# Patient Record
Sex: Female | Born: 1970 | Race: White | Hispanic: No | Marital: Married | Smoking: Never smoker
Health system: Southern US, Community
[De-identification: ages and names within clinical notes are randomized; demographics above are authoritative.]

---

## 2014-07-09 ENCOUNTER — Ambulatory Visit (INDEPENDENT_AMBULATORY_CARE_PROVIDER_SITE_OTHER): Payer: 59

## 2014-07-09 ENCOUNTER — Other Ambulatory Visit: Payer: Self-pay | Admitting: Family Medicine

## 2014-07-09 DIAGNOSIS — M25551 Pain in right hip: Secondary | ICD-10-CM

## 2014-07-09 DIAGNOSIS — M25559 Pain in unspecified hip: Secondary | ICD-10-CM

## 2015-05-17 IMAGING — CR DG HIP COMPLETE 2+V*R*
3 series · 3 of 3 positions shown · non-contrast
Comparison: None.

CLINICAL DATA: Right hip pain 9-10 months.  No injury.

EXAM:
RIGHT HIP - COMPLETE 2+ VIEW

[view not recorded (1 of 3)]
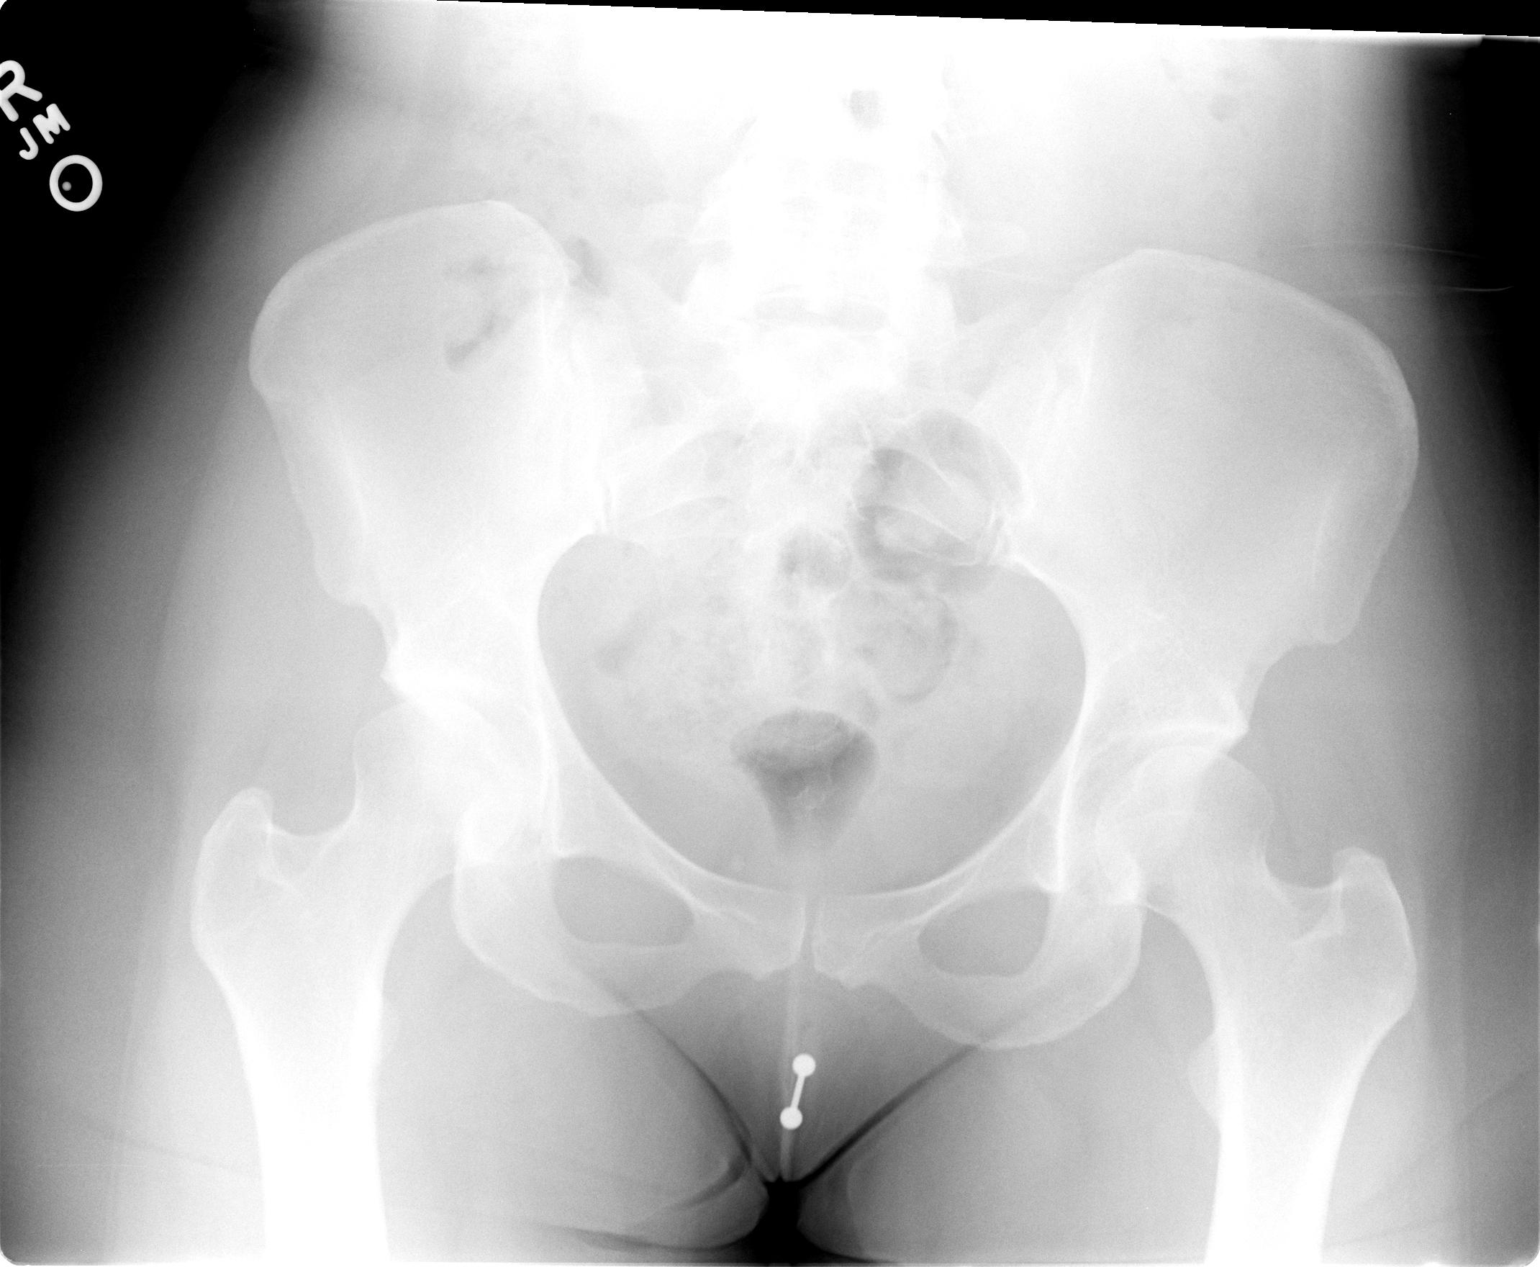

[view not recorded (2 of 3)]
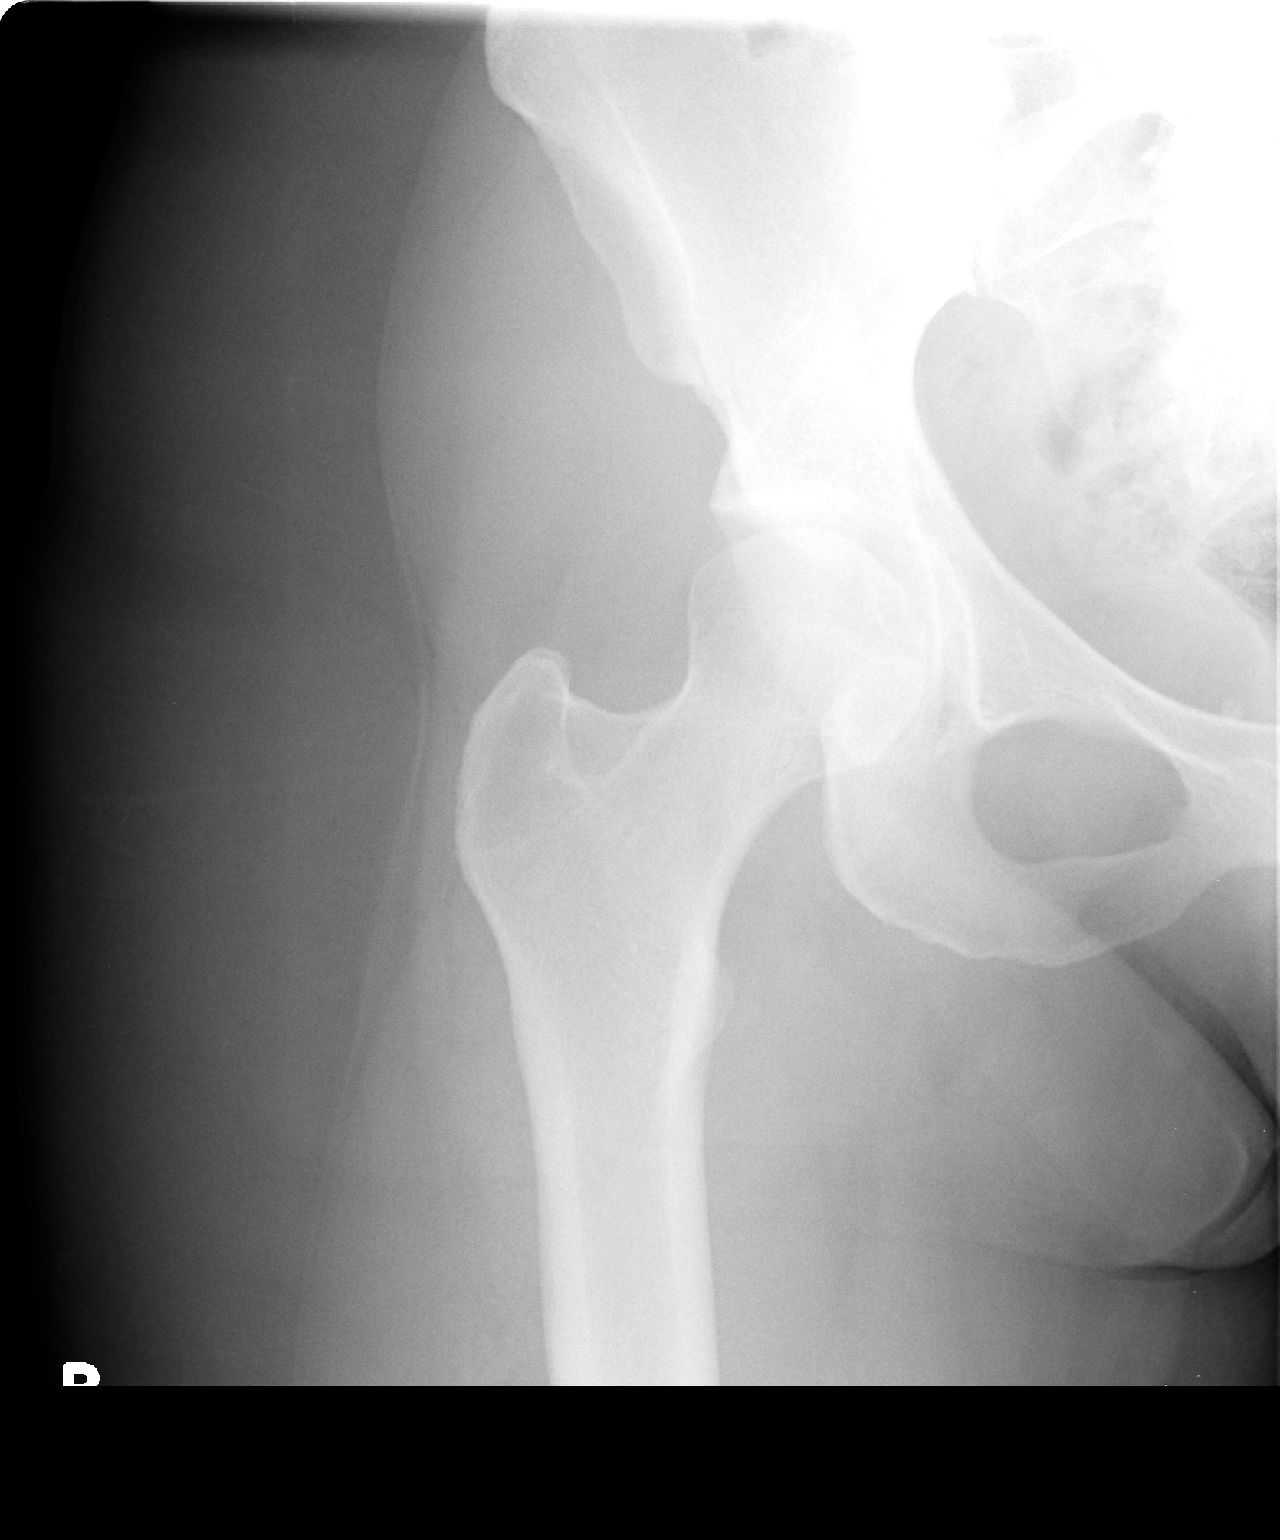

[view not recorded (3 of 3)]
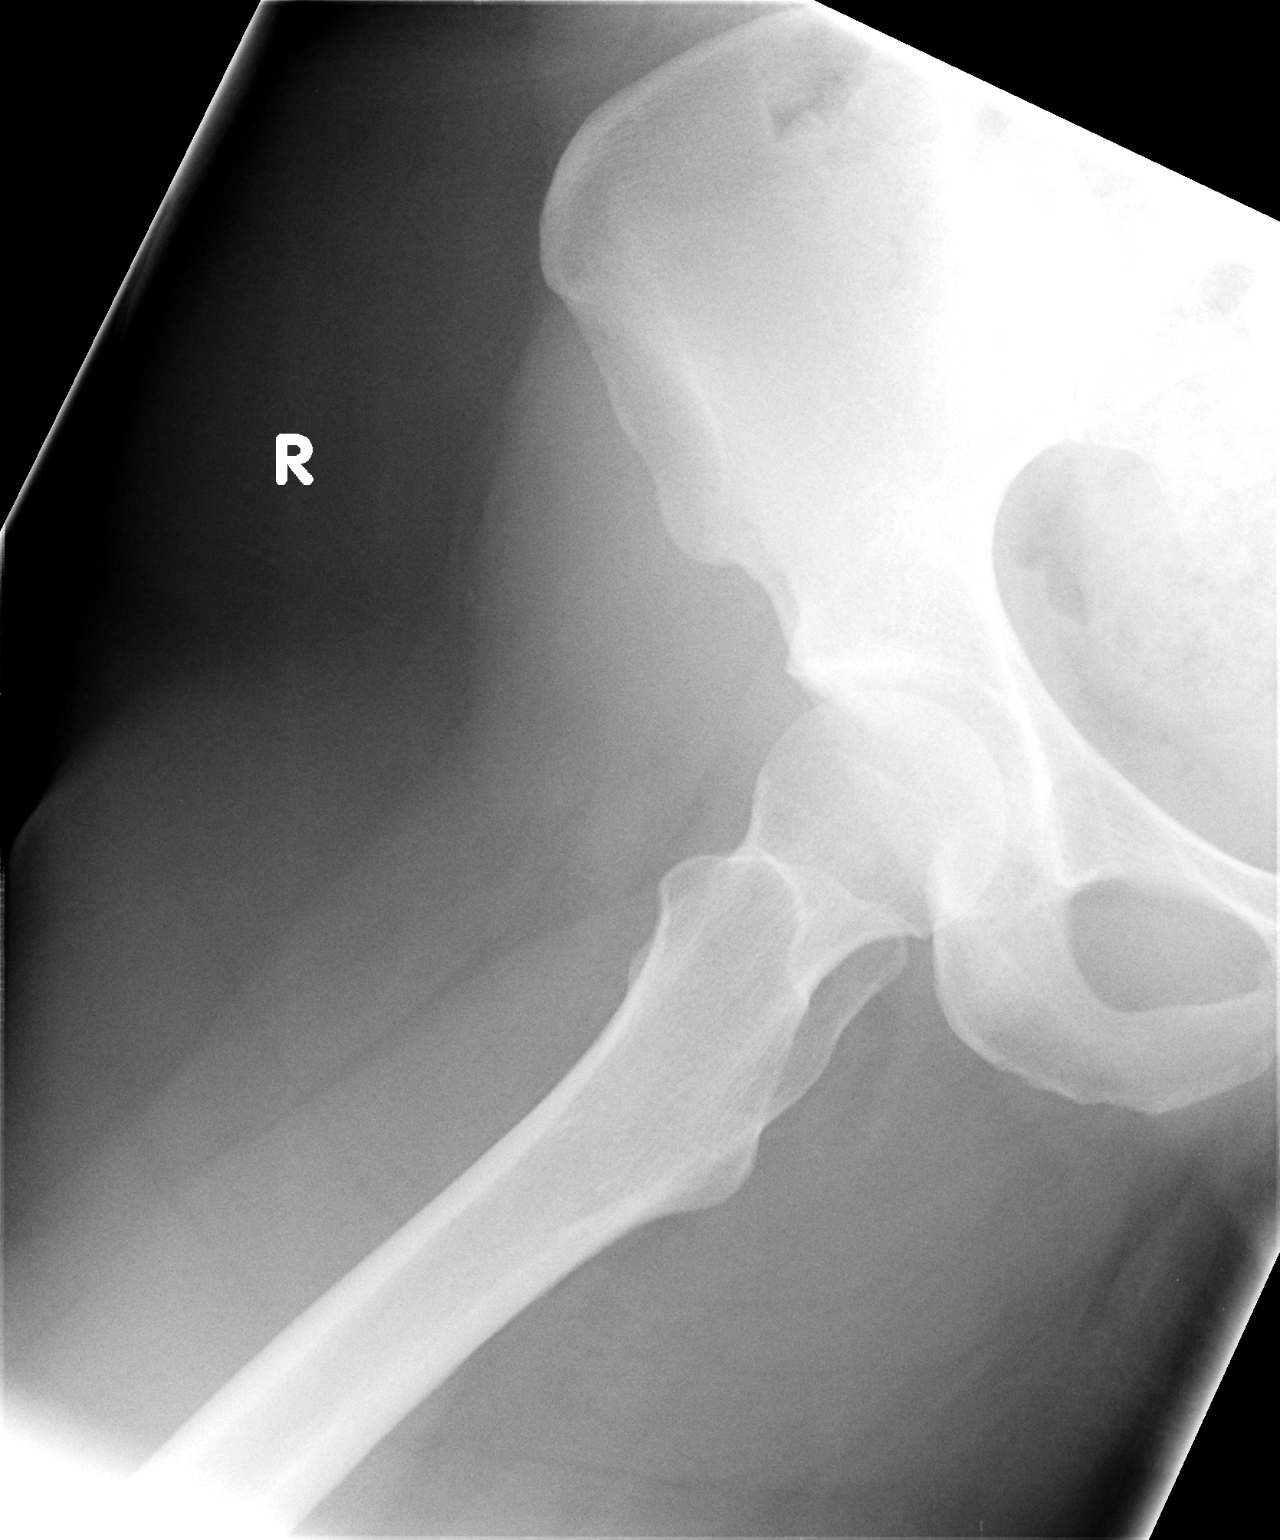

[3 of 3 positions shown; findings below may reference images not displayed]

FINDINGS: There is no evidence of hip fracture or dislocation. There is no
evidence of arthropathy or other focal bone abnormality.
IMPRESSION: Negative.

## 2022-08-05 ENCOUNTER — Ambulatory Visit
Admission: EM | Admit: 2022-08-05 | Discharge: 2022-08-05 | Disposition: A | Discharge status: Home / Self Care | Payer: Self-pay | Attending: Family Medicine | Admitting: Family Medicine

## 2022-08-05 DIAGNOSIS — M7989 Other specified soft tissue disorders: Secondary | ICD-10-CM

## 2022-08-05 MED ORDER — KETOROLAC TROMETHAMINE 60 MG/2ML IM SOLN
60.0000 mg | Freq: Once | INTRAMUSCULAR | Status: AC
  Administered 2022-08-05: 60 mg via INTRAMUSCULAR

## 2022-08-05 NOTE — ED Provider Notes (Signed)
[facility]    CSN: [id number] Arrival date & time: 08/05/22  1101      History   Chief Complaint Chief Complaint  Patient presents with   Arm Swelling    Left arm swelling     HPI VELVA MOLINARI is a 51 y.o. female.   HPI  Patient was admitted to the hospital from 07/31/2022 to 08/03/2022 for an acute bleeding ulcer with microcytic anemia, hemoglobin of 6.2. She had blood transfusions.  She is here because of left arm swelling in the arm where she had her IV. No chest pain or shortness of breath.  No history of clots History reviewed. No pertinent past medical history.  There are no problems to display for this patient.   History reviewed. No pertinent surgical history.  OB History   No obstetric history on file.      Home Medications    Prior to Admission medications   Not on File    Family History History reviewed. No pertinent family history.  Social History Social History   Tobacco Use   Smoking status: Never   Smokeless tobacco: Never  Substance Use Topics   Drug use: Never     Allergies   Diphenhydramine hcl, Sulfa antibiotics, and Cephalexin   Review of Systems Review of Systems   Physical Exam Triage Vital Signs ED Triage Vitals  Enc Vitals Group     BP 08/05/22 1117 (!) 131/90     Pulse Rate 08/05/22 1117 77     Resp 08/05/22 1117 20     Temp 08/05/22 1117 98.7 F (37.1 C)     Temp Source 08/05/22 1117 Oral     SpO2 08/05/22 1117 99 %     Weight 08/05/22 1120 130 lb (59 kg)     Height --      Head Circumference --      Peak Flow --      Pain Score 08/05/22 1119 8     Pain Loc --      Pain Edu? --      Excl. in GC? --    No data found.  Updated Vital Signs BP (!) 131/90 (BP Location: Right Arm)   Pulse 77   Temp 98.7 F (37.1 C) (Oral)   Resp 20   Wt 59 kg   SpO2 99%      Physical Exam Constitutional:      General: She is in acute distress.     Appearance: She is well-developed.     Comments:  Acutely uncomfortable.  Resist movement and exam left arm  HENT:     Head: Normocephalic and atraumatic.  Eyes:     Conjunctiva/sclera: Conjunctivae normal.     Pupils: Pupils are equal, round, and reactive to light.  Cardiovascular:     Rate and Rhythm: Normal rate.  Pulmonary:     Effort: Pulmonary effort is normal. No respiratory distress.  Abdominal:     General: There is no distension.     Palpations: Abdomen is soft.  Musculoskeletal:        General: Swelling and tenderness present. Normal range of motion.       Arms:     Cervical back: Normal range of motion.  Skin:    General: Skin is warm and dry.  Neurological:     Mental Status: She is alert.      UC Treatments / Results  Labs (all labs ordered are listed, but only abnormal results are  displayed) Labs Reviewed - No data to display  EKG   Radiology No results found.  Procedures Procedures (including critical care time)  Medications Ordered in UC Medications  ketorolac (TORADOL) injection 60 mg (60 mg Intramuscular Given 08/05/22 1145)    Initial Impression / Assessment and Plan / UC Course  I have reviewed the triage vital signs and the nursing notes.  Pertinent labs & imaging results that were available during my care of the patient were reviewed by me and considered in my medical decision making (see chart for details).     Discussed superficial versus deep thrombophlebitis.  Unable to do ultrasound testing which is necessary.  Possible cellulitis although less likely. Final Clinical Impressions(s) / UC Diagnoses   Final diagnoses:  Left arm swelling     Discharge Instructions      I am concerned about possible blood clot Go to ER     ED Prescriptions   None    PDMP not reviewed this encounter.   Raylene Everts, MD 08/05/22 [phone]

## 2022-08-05 NOTE — Discharge Instructions (Signed)
I am concerned about possible blood clot Go to ER

## 2022-08-05 NOTE — ED Notes (Signed)
Patient is being discharged from the Urgent Care and sent to the Emergency Department via POV w/ s/o . Per  patient is in need of higher level of care due to possible blood clot in LUE. Patient is aware and verbalizes understanding of plan of care.  Vitals:   08/05/22 1117  BP: (!) 131/90  Pulse: 77  Resp: 20  Temp: 98.7 F (37.1 C)  SpO2: 99%   Report called to [facility] ED RN

## 2022-08-05 NOTE — ED Triage Notes (Signed)
Pt c/o of left arm swelling and pain onset for a few days. Pt reports recent hospital visit on Monday.

## 2022-08-06 ENCOUNTER — Telehealth: Payer: Self-pay | Admitting: Emergency Medicine

## 2022-08-06 NOTE — Telephone Encounter (Signed)
Call by this RN to see how [facility] was today after evaluation in the ED. Pt states the swelling has gone down &  she is using a heating wrap as directed by ED  provider. Pt is elevating arm, thanked Therapist, sports for the follow call
